# Patient Record
Sex: Female | Born: 1995 | Race: White | Hispanic: No | Marital: Single | Smoking: Never smoker
Health system: Southern US, Community
[De-identification: ages and names within clinical notes are randomized; demographics above are authoritative.]

## PROBLEM LIST (undated history)

## (undated) DIAGNOSIS — K859 Acute pancreatitis without necrosis or infection, unspecified: Secondary | ICD-10-CM

## (undated) DIAGNOSIS — C259 Malignant neoplasm of pancreas, unspecified: Secondary | ICD-10-CM

## (undated) HISTORY — PX: OTHER SURGICAL HISTORY: SHX169

## (undated) HISTORY — PX: CHOLECYSTECTOMY: SHX55

---
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
## 2019-10-10 ENCOUNTER — Emergency Department (HOSPITAL_BASED_OUTPATIENT_CLINIC_OR_DEPARTMENT_OTHER)
Admission: EM | Admit: 2019-10-10 | Discharge: 2019-10-10 | Disposition: A | Discharge status: Home / Self Care | Payer: BC Managed Care – PPO | Attending: Emergency Medicine | Admitting: Emergency Medicine

## 2019-10-10 ENCOUNTER — Other Ambulatory Visit: Payer: Self-pay

## 2019-10-10 ENCOUNTER — Encounter (HOSPITAL_BASED_OUTPATIENT_CLINIC_OR_DEPARTMENT_OTHER): Payer: Self-pay | Admitting: Emergency Medicine

## 2019-10-10 DIAGNOSIS — U071 COVID-19: Secondary | ICD-10-CM | POA: Diagnosis not present

## 2019-10-10 DIAGNOSIS — R1110 Vomiting, unspecified: Secondary | ICD-10-CM | POA: Diagnosis present

## 2019-10-10 DIAGNOSIS — R112 Nausea with vomiting, unspecified: Secondary | ICD-10-CM | POA: Diagnosis not present

## 2019-10-10 DIAGNOSIS — Z79899 Other long term (current) drug therapy: Secondary | ICD-10-CM | POA: Diagnosis not present

## 2019-10-10 HISTORY — DX: Acute pancreatitis without necrosis or infection, unspecified: K85.90

## 2019-10-10 HISTORY — DX: Malignant neoplasm of pancreas, unspecified: C25.9

## 2019-10-10 LAB — CBC WITH DIFFERENTIAL/PLATELET
Abs Immature Granulocytes: 0.04 10*3/uL (ref 0.00–0.07)
Basophils Absolute: 0 10*3/uL (ref 0.0–0.1)
Basophils Relative: 1 %
Eosinophils Absolute: 0.1 10*3/uL (ref 0.0–0.5)
Eosinophils Relative: 2 %
HCT: 42.6 % (ref 36.0–46.0)
Hemoglobin: 13.2 g/dL (ref 12.0–15.0)
Immature Granulocytes: 1 %
Lymphocytes Relative: 28 %
Lymphs Abs: 2.4 10*3/uL (ref 0.7–4.0)
MCH: 24.7 pg — ABNORMAL LOW (ref 26.0–34.0)
MCHC: 31 g/dL (ref 30.0–36.0)
MCV: 79.6 fL — ABNORMAL LOW (ref 80.0–100.0)
Monocytes Absolute: 0.6 10*3/uL (ref 0.1–1.0)
Monocytes Relative: 7 %
Neutro Abs: 5.3 10*3/uL (ref 1.7–7.7)
Neutrophils Relative %: 61 %
Platelets: 242 10*3/uL (ref 150–400)
RBC: 5.35 MIL/uL — ABNORMAL HIGH (ref 3.87–5.11)
RDW: 14 % (ref 11.5–15.5)
WBC: 8.4 10*3/uL (ref 4.0–10.5)
nRBC: 0 % (ref 0.0–0.2)

## 2019-10-10 LAB — COMPREHENSIVE METABOLIC PANEL
ALT: 47 U/L — ABNORMAL HIGH (ref 0–44)
AST: 30 U/L (ref 15–41)
Albumin: 3.7 g/dL (ref 3.5–5.0)
Alkaline Phosphatase: 40 U/L (ref 38–126)
Anion gap: 10 (ref 5–15)
BUN: 11 mg/dL (ref 6–20)
CO2: 23 mmol/L (ref 22–32)
Calcium: 8.3 mg/dL — ABNORMAL LOW (ref 8.9–10.3)
Chloride: 109 mmol/L (ref 98–111)
Creatinine, Ser: 0.63 mg/dL (ref 0.44–1.00)
GFR calc Af Amer: >60 mL/min (ref >60)
GFR calc non Af Amer: >60 mL/min (ref >60)
Glucose, Bld: 101 mg/dL — ABNORMAL HIGH (ref 70–99)
Potassium: 3.6 mmol/L (ref 3.5–5.1)
Sodium: 142 mmol/L (ref 135–145)
Total Bilirubin: 1.4 mg/dL — ABNORMAL HIGH (ref 0.3–1.2)
Total Protein: 6.4 g/dL — ABNORMAL LOW (ref 6.5–8.1)

## 2019-10-10 LAB — LIPASE, BLOOD: Lipase: 18 U/L (ref 11–51)

## 2019-10-10 MED ORDER — SODIUM CHLORIDE 0.9 % IV BOLUS (SEPSIS)
1000.0000 mL | Freq: Once | INTRAVENOUS | Status: AC
  Administered 2019-10-10: 06:00:00 1000 mL via INTRAVENOUS

## 2019-10-10 MED ORDER — LORAZEPAM 2 MG/ML IJ SOLN
0.5000 mg | Freq: Once | INTRAMUSCULAR | Status: AC
  Administered 2019-10-10: 0.5 mg via INTRAVENOUS
  Filled 2019-10-10: qty 1

## 2019-10-10 MED ORDER — ONDANSETRON HCL 4 MG/2ML IJ SOLN
INTRAMUSCULAR | Status: AC
  Filled 2019-10-10: qty 2

## 2019-10-10 MED ORDER — HYDROCODONE-ACETAMINOPHEN 5-325 MG PO TABS: 1.0000 | ORAL_TABLET | Freq: Four times a day (QID) | ORAL | 0 refills | Status: AC | PRN

## 2019-10-10 MED ORDER — ONDANSETRON HCL 4 MG/2ML IJ SOLN
4.0000 mg | Freq: Once | INTRAMUSCULAR | Status: AC
  Administered 2019-10-10: 06:00:00 4 mg via INTRAVENOUS
  Filled 2019-10-10: qty 2

## 2019-10-10 MED ORDER — DICYCLOMINE HCL 10 MG/ML IM SOLN
INTRAMUSCULAR | Status: AC
  Filled 2019-10-10: qty 2

## 2019-10-10 MED ORDER — MORPHINE SULFATE (PF) 4 MG/ML IV SOLN
4.0000 mg | Freq: Once | INTRAVENOUS | Status: AC
  Administered 2019-10-10: 11:00:00 4 mg via INTRAVENOUS
  Filled 2019-10-10: qty 1

## 2019-10-10 MED ORDER — DICYCLOMINE HCL 10 MG/ML IM SOLN
20.0000 mg | Freq: Once | INTRAMUSCULAR | Status: AC
  Administered 2019-10-10: 06:00:00 20 mg via INTRAMUSCULAR
  Filled 2019-10-10: qty 2

## 2019-10-10 MED ORDER — ONDANSETRON 4 MG PO TBDP: 4.0000 mg | ORAL_TABLET | Freq: Three times a day (TID) | ORAL | 0 refills | Status: AC | PRN

## 2019-10-10 MED ORDER — METOCLOPRAMIDE HCL 5 MG/ML IJ SOLN
10.0000 mg | Freq: Once | INTRAMUSCULAR | Status: AC
  Administered 2019-10-10: 08:00:00 10 mg via INTRAVENOUS
  Filled 2019-10-10: qty 2

## 2019-10-10 MED FILL — HYDROCODON-APAP 5-325: 5-325 | 2 days supply | Qty: 10 | Fill #0

## 2019-10-10 MED FILL — ONDANSETRON ODT 4 MG TABLET: 4 | 6 days supply | Qty: 20 | Fill #0

## 2019-10-10 NOTE — ED Triage Notes (Signed)
Pt states she tested positive for COVID on December 28th.  Pt states she has been vomiting and unable to keep anything down  Pt has hx of pancreatic cancer and pancreatitis

## 2019-10-10 NOTE — ED Provider Notes (Signed)
TIME SEEN: 5:24 AM  CHIEF COMPLAINT: Nausea, vomiting, Covid positive  HPI: Patient is a 24 year old female who tested Covid positive on 10/02/2019 who presents to the emergency department with complaints of dry cough, nausea and vomiting, diarrhea, left upper quadrant abdominal pain.  States she had a history of pancreatic cancer when she was 24 years old and had a Whipple procedure at 24 years old.  Did not require chemotherapy or radiation.  She is in remission.  She has had recurrent pancreatitis.  States she has had some diarrhea but this has improved.  Still vomiting despite any oral intake.  Called her gastroenterologist at [facility] who recommended she come to the ER.  Denies chest pain or shortness of breath.  No dysuria, hematuria, vaginal bleeding or discharge.  No other abdominal surgeries.  ROS: See HPI Constitutional: no fever  Eyes: no drainage  ENT: no runny nose   Cardiovascular:  no chest pain  Resp: no SOB  GI:  vomiting GU: no dysuria Integumentary: no rash  Allergy: no hives  Musculoskeletal: no leg swelling  Neurological: no slurred speech ROS otherwise negative  PAST MEDICAL HISTORY/PAST SURGICAL HISTORY:  Past Medical History:  Diagnosis Date  . Pancreatic cancer ([place])   . Pancreatitis     MEDICATIONS:  Prior to Admission medications   Medication Sig Start Date End Date Taking? Authorizing Provider  acetaminophen (TYLENOL) 500 MG tablet Take 1,000 mg by mouth every 6 (six) hours as needed. For fever     [provider]  azithromycin (ZITHROMAX) 250 MG tablet Take 250-500 mg by mouth daily. Take 2 tabs on day 1 then take 1 tab on days 2-5     [provider]  dexamethasone (DECADRON) 0.5 MG/5ML solution Inject 5 mg as directed once.      [provider]  loratadine (CLARITIN) 10 MG tablet Take 10 mg by mouth daily.      [provider]  SUMAtriptan Succinate (IMITREX PO) Take 0.5 tablets by mouth every 2 (two) hours as needed.  For migraine     [provider]    ALLERGIES:  Allergies  Allergen Reactions  . Penicillins     SOCIAL HISTORY:  Social History   Tobacco Use  . Smoking status: Never Smoker  . Smokeless tobacco: Never Used  Substance Use Topics  . Alcohol use: No    FAMILY HISTORY: Family History  Problem Relation Age of Onset  . Diabetes Other   . Breast cancer Other     EXAM: BP 118/80 (BP Location: Left Arm)   Pulse 82   Temp 98.5 F (36.9 C) (Oral)   Resp 16   Ht 5\' 7"  (1.702 m)   Wt 124.7 kg   LMP 09/13/2019 (Approximate)   SpO2 98%   BMI 43.07 kg/m  CONSTITUTIONAL: Alert and oriented and responds appropriately to questions. Well-appearing; well-nourished, obese HEAD: Normocephalic EYES: Conjunctivae clear, pupils appear equal, EOM appear intact ENT: normal nose; moist mucous membranes NECK: Supple, normal ROM CARD: RRR; S1 and S2 appreciated; no murmurs, no clicks, no rubs, no gallops RESP: Normal chest excursion without splinting or tachypnea; breath sounds clear and equal bilaterally; no wheezes, no rhonchi, no rales, no hypoxia or respiratory distress, speaking full sentences ABD/GI: Normal bowel sounds; non-distended; soft, very minimally tender diffusely, no rebound, no guarding, no peritoneal signs, no hepatosplenomegaly, no tenderness at McBurney's point BACK:  The back appears normal EXT: Normal ROM in all joints; no deformity noted, no edema; no cyanosis  SKIN: Normal color for age and race; warm; no rash on exposed skin NEURO: Moves all extremities equally PSYCH: The patient's mood and manner are appropriate.   MEDICAL DECISION MAKING: Patient here with nausea and vomiting in the setting of COVID-19 infection.  Did have diarrhea but states this is improving.  Will treat symptoms with Bentyl, Zofran, IV fluids.  Will obtain labs, urine.  Doubt appendicitis, colitis, diverticulitis, perforation, bowel obstruction.  She is concerned about recurrent  pancreatitis.  Will check lipase today.  Currently I do not feel she needs CT imaging given her abdominal exam is quite benign.  ED PROGRESS: Labs and urine pending.  Signed out the oncoming ED physician to follow-up on patient's results and reassess.   I reviewed all nursing notes and pertinent previous records as available.  I have interpreted any EKGs, lab and urine results, imaging (as available).      Dawn Shah was evaluated in Emergency Department on 10/10/2019 for the symptoms described in the history of present illness. She was evaluated in the context of the global COVID-19 pandemic, which necessitated consideration that the patient might be at risk for infection with the SARS-CoV-2 virus that causes COVID-19. Institutional protocols and algorithms that pertain to the evaluation of patients at risk for COVID-19 are in a state of rapid change based on information released by regulatory bodies including the CDC and federal and state organizations. These policies and algorithms were followed during the patient's care in the ED.  Patient was seen wearing N95, face shield, gloves, gown.   Carmine Youngberg, Delice Bison, DO 10/10/19 [phone]

## 2019-10-10 NOTE — ED Notes (Signed)
Pt reports that her nausea has improved.

## 2019-10-10 NOTE — ED Provider Notes (Signed)
Patient's lab work is reassuring with a normal CBC, CMP and lipase of 18.  Anion gap of 10.  On reevaluation patient has had no further vomiting but is still having significant pain in the left upper quadrant.  She states it does feel like her prior pancreatitis but thankfully today lipase is normal.  However we will treat similarly.  Patient given a dose of pain medication.  Will p.o. challenge.   Blanchie Dessert, MD 10/10/19 2019

## 2019-10-10 NOTE — ED Notes (Signed)
Pt states that she is unable to void at this time

## 2019-10-10 NOTE — ED Notes (Signed)
Pt given water and crackers  

## 2019-10-10 NOTE — Discharge Instructions (Addendum)
All your lab work today looked good no evidence of pancreatitis at this time.  However if your symptoms worsen the vomiting returns you start running a high fever or have any shortness of breath you should return for repeat evaluation.
